# Patient Record
Sex: Male | Born: 1975 | Hispanic: Yes | Marital: Single | Smoking: Former smoker
Health system: Southern US, Community
[De-identification: ages and names within clinical notes are randomized; demographics above are authoritative.]

---

## 2008-07-14 ENCOUNTER — Encounter: Payer: Self-pay | Admitting: Orthopedic Surgery

## 2008-08-04 ENCOUNTER — Encounter: Payer: Self-pay | Admitting: Orthopedic Surgery

## 2008-09-04 ENCOUNTER — Encounter: Payer: Self-pay | Admitting: Orthopedic Surgery

## 2016-09-06 ENCOUNTER — Emergency Department
Admission: EM | Admit: 2016-09-06 | Discharge: 2016-09-06 | Disposition: A | Discharge status: Home / Self Care | Payer: Self-pay | Attending: Emergency Medicine | Admitting: Emergency Medicine

## 2016-09-06 ENCOUNTER — Emergency Department: Payer: Self-pay

## 2016-09-06 ENCOUNTER — Encounter: Payer: Self-pay | Admitting: Emergency Medicine

## 2016-09-06 DIAGNOSIS — Y9389 Activity, other specified: Secondary | ICD-10-CM | POA: Insufficient documentation

## 2016-09-06 DIAGNOSIS — Z87891 Personal history of nicotine dependence: Secondary | ICD-10-CM | POA: Insufficient documentation

## 2016-09-06 DIAGNOSIS — Y999 Unspecified external cause status: Secondary | ICD-10-CM | POA: Insufficient documentation

## 2016-09-06 DIAGNOSIS — S239XXA Sprain of unspecified parts of thorax, initial encounter: Secondary | ICD-10-CM

## 2016-09-06 DIAGNOSIS — Y92410 Unspecified street and highway as the place of occurrence of the external cause: Secondary | ICD-10-CM | POA: Insufficient documentation

## 2016-09-06 DIAGNOSIS — S233XXA Sprain of ligaments of thoracic spine, initial encounter: Secondary | ICD-10-CM | POA: Insufficient documentation

## 2016-09-06 DIAGNOSIS — S161XXA Strain of muscle, fascia and tendon at neck level, initial encounter: Secondary | ICD-10-CM | POA: Insufficient documentation

## 2016-09-06 MED ORDER — IBUPROFEN 600 MG PO TABS
600.0000 mg | ORAL_TABLET | Freq: Once | ORAL | Status: AC
  Administered 2016-09-06: 600 mg via ORAL
  Filled 2016-09-06: qty 1
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
## 2016-09-06 NOTE — ED Notes (Signed)
Pt alert and oriented X4, active, cooperative, pt in NAD. RR even and unlabored, color WNL.  Pt informed to return if any life threatening symptoms occur.   
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
